# Patient Record
Sex: Male | Born: 2000 | Race: White | Hispanic: No | Marital: Single
Health system: Southern US, Community
[De-identification: ages and names within clinical notes are randomized; demographics above are authoritative.]

---
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
## 2019-09-13 ENCOUNTER — Telehealth: Payer: Self-pay

## 2019-09-13 NOTE — Telephone Encounter (Signed)
Copied from CRM [id number]. Topic: Appointment Scheduling - Scheduling Inquiry for Clinic >> Sep 13, 2019  8:41 AM Dalphine Handing A wrote: Patients father Sheri Prows would like a callback in regards to Dr. Sullivan Lone taking on son as new patient and being seen as soon as possible. Mr. Deruiter has some concerns in regards to patient having some dizzy spells while away at school and is in town now and would like to be seen. Please advise . Best contact [phone]

## 2019-09-13 NOTE — Telephone Encounter (Signed)
I think we could see him Wed.

## 2019-09-13 NOTE — Telephone Encounter (Signed)
Please advise 

## 2019-09-14 NOTE — Telephone Encounter (Signed)
Patient's father was advised and declined appointment, patient's father took him to be seen at [place].

## 2019-09-15 ENCOUNTER — Ambulatory Visit: Payer: Self-pay | Admitting: Family Medicine
